# Patient Record
Sex: Female | Born: 1990 | Race: White | Hispanic: No | Marital: Married | Smoking: Former smoker
Health system: Southern US, Community
[De-identification: ages and names within clinical notes are randomized; demographics above are authoritative.]

## PROBLEM LIST (undated history)

## (undated) DIAGNOSIS — J189 Pneumonia, unspecified organism: Secondary | ICD-10-CM

## (undated) DIAGNOSIS — J45909 Unspecified asthma, uncomplicated: Secondary | ICD-10-CM

## (undated) HISTORY — DX: Pneumonia, unspecified organism: J18.9

## (undated) HISTORY — DX: Unspecified asthma, uncomplicated: J45.909

---

## 2016-08-18 DIAGNOSIS — F411 Generalized anxiety disorder: Secondary | ICD-10-CM | POA: Insufficient documentation

## 2016-08-18 HISTORY — DX: Generalized anxiety disorder: F41.1

## 2017-03-01 DIAGNOSIS — F321 Major depressive disorder, single episode, moderate: Secondary | ICD-10-CM | POA: Insufficient documentation

## 2017-03-01 HISTORY — DX: Major depressive disorder, single episode, moderate: F32.1

## 2018-03-21 DIAGNOSIS — R102 Pelvic and perineal pain: Secondary | ICD-10-CM

## 2018-03-21 HISTORY — DX: Pelvic and perineal pain: R10.2

## 2018-08-04 DIAGNOSIS — F339 Major depressive disorder, recurrent, unspecified: Secondary | ICD-10-CM

## 2018-08-04 DIAGNOSIS — Z720 Tobacco use: Secondary | ICD-10-CM

## 2018-08-04 HISTORY — DX: Tobacco use: Z72.0

## 2018-08-04 HISTORY — DX: Major depressive disorder, recurrent, unspecified: F33.9

## 2019-02-26 DIAGNOSIS — M67912 Unspecified disorder of synovium and tendon, left shoulder: Secondary | ICD-10-CM | POA: Insufficient documentation

## 2019-02-26 HISTORY — DX: Unspecified disorder of synovium and tendon, left shoulder: M67.912

## 2019-02-28 DIAGNOSIS — R109 Unspecified abdominal pain: Secondary | ICD-10-CM

## 2019-02-28 HISTORY — DX: Unspecified abdominal pain: R10.9

## 2019-04-10 DIAGNOSIS — R87810 Cervical high risk human papillomavirus (HPV) DNA test positive: Secondary | ICD-10-CM

## 2019-04-10 HISTORY — DX: Cervical high risk human papillomavirus (HPV) DNA test positive: R87.810

## 2019-10-18 ENCOUNTER — Encounter: Payer: Self-pay | Admitting: Cardiology

## 2019-10-18 ENCOUNTER — Other Ambulatory Visit: Payer: Self-pay

## 2019-10-18 ENCOUNTER — Telehealth: Payer: Self-pay | Admitting: Cardiology

## 2019-10-18 ENCOUNTER — Ambulatory Visit (INDEPENDENT_AMBULATORY_CARE_PROVIDER_SITE_OTHER): Payer: PRIVATE HEALTH INSURANCE | Admitting: Cardiology

## 2019-10-18 VITALS — BP 98/60 | HR 95 | Ht 67.0 in | Wt 181.6 lb

## 2019-10-18 DIAGNOSIS — R0602 Shortness of breath: Secondary | ICD-10-CM

## 2019-10-18 DIAGNOSIS — R0781 Pleurodynia: Secondary | ICD-10-CM

## 2019-10-18 DIAGNOSIS — I313 Pericardial effusion (noninflammatory): Secondary | ICD-10-CM

## 2019-10-18 DIAGNOSIS — I3139 Other pericardial effusion (noninflammatory): Secondary | ICD-10-CM

## 2019-10-18 DIAGNOSIS — Z79899 Other long term (current) drug therapy: Secondary | ICD-10-CM

## 2019-10-18 DIAGNOSIS — I309 Acute pericarditis, unspecified: Secondary | ICD-10-CM | POA: Diagnosis not present

## 2019-10-18 HISTORY — DX: Pleurodynia: R07.81

## 2019-10-18 HISTORY — DX: Other pericardial effusion (noninflammatory): I31.39

## 2019-10-18 HISTORY — DX: Other long term (current) drug therapy: Z79.899

## 2019-10-18 HISTORY — DX: Pericardial effusion (noninflammatory): I31.3

## 2019-10-18 HISTORY — DX: Shortness of breath: R06.02

## 2019-10-18 LAB — D-DIMER, QUANTITATIVE: D-DIMER: 0.27 mg/L FEU (ref 0.00–0.49)

## 2019-10-18 MED ORDER — COLCHICINE 0.6 MG PO TABS: 0.6000 mg | ORAL_TABLET | Freq: Two times a day (BID) | ORAL | 3 refills | Status: DC

## 2019-10-18 MED ORDER — IBUPROFEN 800 MG PO TABS: 800.0000 mg | ORAL_TABLET | Freq: Two times a day (BID) | ORAL | 3 refills | Status: DC

## 2019-10-18 NOTE — Telephone Encounter (Signed)
Precert needed for: CT ANGIO CHEST AORTA W/CM & OR WO/CM  Location: [facility]   Date: ASAP

## 2019-10-18 NOTE — Patient Instructions (Addendum)
Medication Instructions:  1) Start Colchicine 0.6 mg twice a day   2) Start Ibuprofen 800 mg every 12 hours   *If you need a refill on your cardiac medications before your next appointment, please call your pharmacy*   Lab Work: Bmp, Mag, Cbc, Sed Rate, C-reative protein, D-Dimer- Today   If you have labs (blood work) drawn today and your tests are completely normal, you will receive your results only by: Marland Kitchen MyChart Message (if you have MyChart) OR . A paper copy in the mail If you have any lab test that is abnormal or we need to change your treatment, we will call you to review the results.   Testing/Procedures: Your physician has requested that you have an echocardiogram. Echocardiography is a painless test that uses sound waves to create images of your heart. It provides your doctor with information about the size and shape of your heart and how well your heart's chambers and valves are working. This procedure takes approximately one hour. There are no restrictions for this procedure.  Your physician has ordered for you to have a chest CT     Follow-Up: At [facility], you and your health needs are our priority.  As part of our continuing mission to provide you with exceptional heart care, we have created designated Provider Care Teams.  These Care Teams include your primary Cardiologist (physician) and Advanced Practice Providers (APPs -  Physician Assistants and Nurse Practitioners) who all work together to provide you with the care you need, when you need it.  We recommend signing up for the patient portal called "MyChart".  Sign up information is provided on this After Visit Summary.  MyChart is used to connect with patients for Virtual Visits (Telemedicine).  Patients are able to view lab/test results, encounter notes, upcoming appointments, etc.  Non-urgent messages can be sent to your provider as well.   To learn more about what you can do with MyChart, go to  [email or web address].    Your next appointment:   2 week(s)  The format for your next appointment:   In Person  Provider:   Thomasene Ripple, DO   Other Instructions       After the Test: None

## 2019-10-18 NOTE — Progress Notes (Signed)
Cardiology Office Note:    Date:  10/18/2019   ID:  Vicki Hubbard, DOB 03-04-1990, MRN [id number]  PCP:  Gara Kroner, DO  Cardiologist:  Berniece Salines, DO  Electrophysiologist:  None   Referring MD: No ref. provider found  Follow up visit   History of Present Illness:    Vicki Hubbard is a 29 y.o. female with a hx of asthma presents today to be evaluated for chest pain.  The patient tells me that this all started on Sunday when she started experience significant abrupt onset of shortness of breath and chest pain.  She notes that initially she took her inhaler which made her feel worse than she felt.  She then took it easy that day and then Monday felt a little better had some physical activity which involves intimacy with her husband and suddenly after that she felt shaky was significant redness of breath and worsening chest pain.  She describes the pain as a sharp sensation which mostly occurs when she lie flat and when she starts to breathe.  She tells me when she sits up the pain feels better so the last couple days she has been leaning forward when she sits in order to have relief from the pain when she breathes.  She has also been lean on the side when she sleeps when she is not laying flat and experiencing worsening pain.  She was seen at the [facility] urgent care center at which time she was told that she needed to see cardiology.  Today in the office as I talk to her she sitting forward and tells me this is the best position when she leans forward is her breathing gets significantly better.  There is also pain with inspiration as well.  She has had no lightheadedness or dizziness.  Is never experienced such pain in her life.  Unfortunately she has not tried any medication to help relieve her pain.  Past Medical History:  Diagnosis Date  . Asthma   . PNA (pneumonia)     Past Surgical History:  Procedure Laterality Date  . CESAREAN SECTION      Current Medications: Current  Meds  Medication Sig  . [DISCONTINUED] ibuprofen (ADVIL) 800 MG tablet Take by mouth.     Allergies:   Patient has no known allergies.   Social History   Socioeconomic History  . Marital status: Married    Spouse name: Not on file  . Number of children: Not on file  . Years of education: Not on file  . Highest education level: Not on file  Occupational History  . Not on file  Tobacco Use  . Smoking status: Current Every Day Smoker    Packs/day: 1.00    Types: Cigarettes  . Smokeless tobacco: Never Used  . Tobacco comment: Rare  Substance and Sexual Activity  . Alcohol use: Yes    Comment: occsionally  . Drug use: Never  . Sexual activity: Not on file  Other Topics Concern  . Not on file  Social History Narrative  . Not on file   Social Determinants of Health   Financial Resource Strain:   . Difficulty of Paying Living Expenses: Not on file  Food Insecurity:   . Worried About Charity fundraiser in the Last Year: Not on file  . Ran Out of Food in the Last Year: Not on file  Transportation Needs:   . Lack of Transportation (Medical): Not on file  . Lack of  Transportation (Non-Medical): Not on file  Physical Activity:   . Days of Exercise per Week: Not on file  . Minutes of Exercise per Session: Not on file  Stress:   . Feeling of Stress : Not on file  Social Connections:   . Frequency of Communication with Friends and Family: Not on file  . Frequency of Social Gatherings with Friends and Family: Not on file  . Attends Religious Services: Not on file  . Active Member of Clubs or Organizations: Not on file  . Attends Archivist Meetings: Not on file  . Marital Status: Not on file     Family History: The patient's family history includes Cancer in her maternal grandfather and paternal grandmother; Diabetes in her maternal grandfather and maternal grandmother; Hypertension in her maternal grandfather and maternal grandmother.  ROS:   Review of Systems   Constitution: Negative for decreased appetite, fever and weight gain.  HENT: Negative for congestion, ear discharge, hoarse voice and sore throat.   Eyes: Negative for discharge, redness, vision loss in right eye and visual halos.  Cardiovascular: Negative for chest pain, dyspnea on exertion, leg swelling, orthopnea and palpitations.  Respiratory: Negative for cough, hemoptysis, shortness of breath and snoring.   Endocrine: Negative for heat intolerance and polyphagia.  Hematologic/Lymphatic: Negative for bleeding problem. Does not bruise/bleed easily.  Skin: Negative for flushing, nail changes, rash and suspicious lesions.  Musculoskeletal: Negative for arthritis, joint pain, muscle cramps, myalgias, neck pain and stiffness.  Gastrointestinal: Negative for abdominal pain, bowel incontinence, diarrhea and excessive appetite.  Genitourinary: Negative for decreased libido, genital sores and incomplete emptying.  Neurological: Negative for brief paralysis, focal weakness, headaches and loss of balance.  Psychiatric/Behavioral: Negative for altered mental status, depression and suicidal ideas.  Allergic/Immunologic: Negative for HIV exposure and persistent infections.    EKGs/Labs/Other Studies Reviewed:    The following studies were reviewed today:   EKG:  The ekg ordered today demonstrates sinus rhythm, heart rate 95 bpm with nonspecific ST changes.  No prior EKG for comparison.  Recent Labs: No results found for requested labs within last 8760 hours.  Recent Lipid Panel No results found for: CHOL, TRIG, HDL, CHOLHDL, VLDL, LDLCALC, LDLDIRECT  Physical Exam:    VS:  BP 98/60 (BP Location: Left Arm)   Pulse 95   Ht $R'5\' 7"'OC$  (1.702 m)   Wt 181 lb 9.6 oz (82.4 kg)   SpO2 99%   BMI 28.44 kg/m     Wt Readings from Last 3 Encounters:  10/18/19 181 lb 9.6 oz (82.4 kg)     GEN: Well nourished, well developed in no acute distress HEENT: Normal NECK: No JVD; No carotid  bruits LYMPHATICS: No lymphadenopathy CARDIAC: S1S2 noted,RRR, no murmurs, rubs, gallops RESPIRATORY:  Clear to auscultation without rales, wheezing or rhonchi  ABDOMEN: Soft, non-tender, non-distended, +bowel sounds, no guarding. EXTREMITIES: No edema, No cyanosis, no clubbing MUSCULOSKELETAL:  No deformity  SKIN: Warm and dry NEUROLOGIC:  Alert and oriented x 3, non-focal PSYCHIATRIC:  Normal affect, good insight  ASSESSMENT:    1. Acute pericarditis, unspecified type   2. Pleuritic chest pain    3. Medication management   4. SOB (shortness of breath)   5. Small circumferential Pericardial effusion    PLAN:    I was able to do a bedside echocardiogram for the patient while she was in the office limited for pericardial effusion which showed very small circumferential pericardial effusion with no evidence of cardiac tamponade.  In  addition to her clinical history I am suspecting clinical pericarditis therefore I am going to treat the patient with ibuprofen 800 mg twice daily along with colchicine 0.6 mg twice daily.  I have educated patient about both medications all of her questions has been answered she is agreeable to take these medications.  The patient need a full comprehensive transthoracic echocardiogram scheduled to make sure that her small pericardial effusion did not worsened as well assess her LV function and diastology as well.  In addition given the abrupt onset of the shortness of breath and pain still pleuritic I like to rule out pulmonary embolism therefore a chest CTA has been ordered ordered urgently.  Blood work will be done today which will include D-dimer, CRP, ESR, BMP and mag.    The patient is in agreement with the above plan. The patient left the office in stable condition.  The patient will follow up in 2 weeks.    Medication Adjustments/Labs and Tests Ordered: Current medicines are reviewed at length with the patient today.  Concerns regarding medicines  are outlined above.  Orders Placed This Encounter  Procedures  . CT ANGIO CHEST AORTA W/CM & OR WO/CM  . Basic metabolic panel  . Magnesium  . CBC  . Sedimentation rate  . C-reactive protein  . D-Dimer, Quantitative  . EKG 12-Lead  . ECHOCARDIOGRAM COMPLETE   Meds ordered this encounter  Medications  . colchicine 0.6 MG tablet    Sig: Take 1 tablet (0.6 mg total) by mouth in the morning and at bedtime.    Dispense:  180 tablet    Refill:  3  . ibuprofen (IBU) 800 MG tablet    Sig: Take 1 tablet (800 mg total) by mouth every 12 (twelve) hours.    Dispense:  180 tablet    Refill:  3    Patient Instructions  Medication Instructions:  1) Start Colchicine 0.6 mg twice a day   2) Start Ibuprofen 800 mg every 12 hours   *If you need a refill on your cardiac medications before your next appointment, please call your pharmacy*   Lab Work: Bmp, Mag, Cbc, Sed Rate, C-reative protein, D-Dimer- Today   If you have labs (blood work) drawn today and your tests are completely normal, you will receive your results only by: Marland Kitchen MyChart Message (if you have MyChart) OR . A paper copy in the mail If you have any lab test that is abnormal or we need to change your treatment, we will call you to review the results.   Testing/Procedures: Your physician has requested that you have an echocardiogram. Echocardiography is a painless test that uses sound waves to create images of your heart. It provides your doctor with information about the size and shape of your heart and how well your heart's chambers and valves are working. This procedure takes approximately one hour. There are no restrictions for this procedure.  Your physician has ordered for you to have a chest CT     Follow-Up: At [facility], you and your health needs are our priority.  As part of our continuing mission to provide you with exceptional heart care, we have created designated Provider Care Teams.  These Care Teams  include your primary Cardiologist (physician) and Advanced Practice Providers (APPs -  Physician Assistants and Nurse Practitioners) who all work together to provide you with the care you need, when you need it.  We recommend signing up for the patient portal called "MyChart".  Sign up information is provided on this After Visit Summary.  MyChart is used to connect with patients for Virtual Visits (Telemedicine).  Patients are able to view lab/test results, encounter notes, upcoming appointments, etc.  Non-urgent messages can be sent to your provider as well.   To learn more about what you can do with MyChart, go to [email or web address].    Your next appointment:   2 week(s)  The format for your next appointment:   In Person  Provider:   Berniece Salines, DO   Other Instructions       After the Test: None      Adopting a Healthy Lifestyle.  Know what a healthy weight is for you (roughly BMI <25) and aim to maintain this   Aim for 7+ servings of fruits and vegetables daily   65-80+ fluid ounces of water or unsweet tea for healthy kidneys   Limit to max 1 drink of alcohol per day; avoid smoking/tobacco   Limit animal fats in diet for cholesterol and heart health - choose grass fed whenever available   Avoid highly processed foods, and foods high in saturated/trans fats   Aim for low stress - take time to unwind and care for your mental health   Aim for 150 min of moderate intensity exercise weekly for heart health, and weights twice weekly for bone health   Aim for 7-9 hours of sleep daily   When it comes to diets, agreement about the perfect plan isnt easy to find, even among the experts. Experts at the [employer] developed an idea known as the Healthy Eating Plate. Just imagine a plate divided into logical, healthy portions.   The emphasis is on diet quality:   Load up on vegetables and fruits - one-half of your plate: Aim for color and variety, and  remember that potatoes dont count.   Go for whole grains - one-quarter of your plate: Whole wheat, barley, wheat berries, quinoa, oats, brown rice, and foods made with them. If you want pasta, go with whole wheat pasta.   Protein power - one-quarter of your plate: Fish, chicken, beans, and nuts are all healthy, versatile protein sources. Limit red meat.   The diet, however, does go beyond the plate, offering a few other suggestions.   Use healthy plant oils, such as olive, canola, soy, corn, sunflower and peanut. Check the labels, and avoid partially hydrogenated oil, which have unhealthy trans fats.   If youre thirsty, drink water. Coffee and tea are good in moderation, but skip sugary drinks and limit milk and dairy products to one or two daily servings.   The type of carbohydrate in the diet is more important than the amount. Some sources of carbohydrates, such as vegetables, fruits, whole grains, and beans-are healthier than others.   Finally, stay active  Signed, Berniece Salines, DO  10/18/2019 12:13 PM    [facility]

## 2019-10-19 LAB — BASIC METABOLIC PANEL
BUN/Creatinine Ratio: 10 (ref 9–23)
BUN: 8 mg/dL (ref 6–20)
CO2: 23 mmol/L (ref 20–29)
Calcium: 9.3 mg/dL (ref 8.7–10.2)
Chloride: 102 mmol/L (ref 96–106)
Creatinine, Ser: 0.8 mg/dL (ref 0.57–1.00)
GFR calc Af Amer: 114 mL/min/{1.73_m2} (ref >59)
GFR calc non Af Amer: 99 mL/min/{1.73_m2} (ref >59)
Glucose: 80 mg/dL (ref 65–99)
Potassium: 3.9 mmol/L (ref 3.5–5.2)
Sodium: 137 mmol/L (ref 134–144)

## 2019-10-19 LAB — CBC
Hematocrit: 42.8 % (ref 34.0–46.6)
Hemoglobin: 14.2 g/dL (ref 11.1–15.9)
MCH: 30.9 pg (ref 26.6–33.0)
MCHC: 33.2 g/dL (ref 31.5–35.7)
MCV: 93 fL (ref 79–97)
Platelets: 270 10*3/uL (ref 150–450)
RBC: 4.59 x10E6/uL (ref 3.77–5.28)
RDW: 11.8 % (ref 11.7–15.4)
WBC: 6 10*3/uL (ref 3.4–10.8)

## 2019-10-19 LAB — MAGNESIUM: Magnesium: 2.2 mg/dL (ref 1.6–2.3)

## 2019-10-19 LAB — C-REACTIVE PROTEIN: CRP: <1 mg/L (ref 0–10)

## 2019-10-19 LAB — SEDIMENTATION RATE: Sed Rate: 2 mm/hr (ref 0–32)

## 2019-10-22 ENCOUNTER — Other Ambulatory Visit: Payer: Self-pay

## 2019-10-22 ENCOUNTER — Telehealth: Payer: Self-pay | Admitting: Cardiology

## 2019-10-22 ENCOUNTER — Telehealth: Payer: Self-pay

## 2019-10-22 ENCOUNTER — Encounter (HOSPITAL_BASED_OUTPATIENT_CLINIC_OR_DEPARTMENT_OTHER): Payer: Self-pay

## 2019-10-22 ENCOUNTER — Ambulatory Visit (HOSPITAL_BASED_OUTPATIENT_CLINIC_OR_DEPARTMENT_OTHER)
Admission: RE | Admit: 2019-10-22 | Discharge: 2019-10-22 | Disposition: A | Discharge status: Home / Self Care | Payer: PRIVATE HEALTH INSURANCE | Source: Ambulatory Visit | Attending: Cardiology | Admitting: Cardiology

## 2019-10-22 DIAGNOSIS — R0781 Pleurodynia: Secondary | ICD-10-CM | POA: Diagnosis present

## 2019-10-22 MED ORDER — PROPRANOLOL HCL 10 MG PO TABS: 10.0000 mg | ORAL_TABLET | Freq: Every day | ORAL | 1 refills | Status: DC

## 2019-10-22 MED ORDER — IOHEXOL 350 MG/ML SOLN
100.0000 mL | Freq: Once | INTRAVENOUS | Status: AC | PRN
  Administered 2019-10-22: 100 mL via INTRAVENOUS

## 2019-10-22 NOTE — Telephone Encounter (Signed)
-----   Message from Thomasene Ripple, DO sent at 10/22/2019  1:59 PM EDT ----- Thankfully you do not have pulmonary embolism.  This is great news.  There is incidental finding of 3 mm nodule in your left lung.  Nothing to do for his right now.  We will have to forward this to your primary care doctor as you would have to get a repeat of your CTA in 1 year due to the nodule.

## 2019-10-22 NOTE — Telephone Encounter (Signed)
Spoke with patient regarding results and recommendation.  Patient verbalizes understanding and is agreeable to plan of care. Advised patient to call back with any issues or concerns.  

## 2019-10-22 NOTE — Telephone Encounter (Signed)
Let her know for chest pain continue to recur and I want her to go to the emergency department to be seen.

## 2019-10-22 NOTE — Telephone Encounter (Signed)
Follow Up:    Pt called and said  She is still having problems. When she lays down it feels like it is going to stop and then it speeds. It goes from low to high. She thinks it is getting worse instead of better She needs to know what she needs to do. Does hse need to come to see Dr Servando Salina?

## 2019-10-22 NOTE — Telephone Encounter (Signed)
Called patient. She reports that she does still have chest pain at times. Last night she had a episode that caused her to shake uncontrollably. She actually called ems last night they did a ekg and told her nothing urgent that they could see, however they advised to go to the emergency room but she didn't. She will try propranolol10 mg daily at night. Will let Dr. Servando Salina know of this information.

## 2019-10-22 NOTE — Telephone Encounter (Signed)
Called patient. She reports that her heart rate continues to increase and decrease she feels palpitations still also. She has been on colchicine for 4 days now and wants to know if that medicine was supposed to help relieve those symptoms because it has not.

## 2019-10-22 NOTE — Telephone Encounter (Signed)
The colchicine was to help relieve the chest pain syndrome.  If she still experiencing chest pain?  She can start propanolol 10 mg daily at nighttime for the palpitations.

## 2019-10-22 NOTE — Telephone Encounter (Signed)
Called patient back informed her that Dr. Servando Salina wants her to go to the emergency room if chest pain returns. She verbally understood. No further questions.

## 2019-10-24 ENCOUNTER — Other Ambulatory Visit: Payer: Self-pay

## 2019-10-24 ENCOUNTER — Emergency Department (HOSPITAL_BASED_OUTPATIENT_CLINIC_OR_DEPARTMENT_OTHER)
Admission: EM | Admit: 2019-10-24 | Discharge: 2019-10-24 | Disposition: A | Discharge status: Home / Self Care | Payer: PRIVATE HEALTH INSURANCE | Attending: Emergency Medicine | Admitting: Emergency Medicine

## 2019-10-24 ENCOUNTER — Emergency Department (HOSPITAL_BASED_OUTPATIENT_CLINIC_OR_DEPARTMENT_OTHER): Payer: PRIVATE HEALTH INSURANCE

## 2019-10-24 ENCOUNTER — Encounter (HOSPITAL_BASED_OUTPATIENT_CLINIC_OR_DEPARTMENT_OTHER): Payer: Self-pay | Admitting: *Deleted

## 2019-10-24 ENCOUNTER — Telehealth: Payer: Self-pay | Admitting: Cardiology

## 2019-10-24 DIAGNOSIS — R079 Chest pain, unspecified: Secondary | ICD-10-CM | POA: Diagnosis not present

## 2019-10-24 DIAGNOSIS — Z5321 Procedure and treatment not carried out due to patient leaving prior to being seen by health care provider: Secondary | ICD-10-CM | POA: Insufficient documentation

## 2019-10-24 LAB — CBC WITH DIFFERENTIAL/PLATELET
Abs Immature Granulocytes: 0.01 10*3/uL (ref 0.00–0.07)
Basophils Absolute: 0 10*3/uL (ref 0.0–0.1)
Basophils Relative: 1 %
Eosinophils Absolute: 0.1 10*3/uL (ref 0.0–0.5)
Eosinophils Relative: 2 %
HCT: 38.4 % (ref 36.0–46.0)
Hemoglobin: 12.7 g/dL (ref 12.0–15.0)
Immature Granulocytes: 0 %
Lymphocytes Relative: 37 %
Lymphs Abs: 1.9 10*3/uL (ref 0.7–4.0)
MCH: 30.4 pg (ref 26.0–34.0)
MCHC: 33.1 g/dL (ref 30.0–36.0)
MCV: 91.9 fL (ref 80.0–100.0)
Monocytes Absolute: 0.3 10*3/uL (ref 0.1–1.0)
Monocytes Relative: 6 %
Neutro Abs: 2.8 10*3/uL (ref 1.7–7.7)
Neutrophils Relative %: 54 %
Platelets: 236 10*3/uL (ref 150–400)
RBC: 4.18 MIL/uL (ref 3.87–5.11)
RDW: 11.5 % (ref 11.5–15.5)
WBC: 5.1 10*3/uL (ref 4.0–10.5)
nRBC: 0 % (ref 0.0–0.2)

## 2019-10-24 LAB — COMPREHENSIVE METABOLIC PANEL
ALT: 14 U/L (ref 0–44)
AST: 14 U/L — ABNORMAL LOW (ref 15–41)
Albumin: 4.4 g/dL (ref 3.5–5.0)
Alkaline Phosphatase: 43 U/L (ref 38–126)
Anion gap: 10 (ref 5–15)
BUN: 11 mg/dL (ref 6–20)
CO2: 23 mmol/L (ref 22–32)
Calcium: 8.8 mg/dL — ABNORMAL LOW (ref 8.9–10.3)
Chloride: 106 mmol/L (ref 98–111)
Creatinine, Ser: 0.75 mg/dL (ref 0.44–1.00)
GFR calc Af Amer: >60 mL/min (ref >60)
GFR calc non Af Amer: >60 mL/min (ref >60)
Glucose, Bld: 98 mg/dL (ref 70–99)
Potassium: 3.7 mmol/L (ref 3.5–5.1)
Sodium: 139 mmol/L (ref 135–145)
Total Bilirubin: 0.6 mg/dL (ref 0.3–1.2)
Total Protein: 7.1 g/dL (ref 6.5–8.1)

## 2019-10-24 LAB — TROPONIN I (HIGH SENSITIVITY): Troponin I (High Sensitivity): <2 ng/L (ref <18)

## 2019-10-24 NOTE — Telephone Encounter (Signed)
Spoke to patient just now and she let me know that her chest pain started at 11:15 AM. She states that it is continuous and is all the way across her chest. Position changes do not help. Her current blood pressure is  117/77, HR is 65. She has not taken any medications at this time. She took her normal scheduled medications this morning. Denies any shortness of breath, dizziness, or pressure on the chest. She states that she just feels tired and has this chest pain.   I advised that she proceed to the ED at this time. She states that she is unsure if she will go or not at this time but I did encourage her to go and be evaluated as I felt like this was best with the continuous chest pain with no relief.

## 2019-10-24 NOTE — Telephone Encounter (Signed)
Pt c/o of Chest Pain: STAT if CP now or developed within 24 hours  1. Are you having CP right now? yes  2. Are you experiencing any other symptoms (ex. SOB, nausea, vomiting, sweating)? no  3. How long have you been experiencing CP? An hour and a half  4. Is your CP continuous or coming and going? Continuous   5. Have you taken Nitroglycerin? No   Patient states she has been having chest pain for an hour and a half. She states she is having it across her entire chest, back, and bottom of her neck. She states it came of suddenly and she is not having any other symptoms. ?

## 2019-10-24 NOTE — ED Triage Notes (Signed)
C/o x 1 day , recent dx pericarditis

## 2019-10-25 ENCOUNTER — Encounter: Payer: Self-pay | Admitting: Cardiology

## 2019-10-25 NOTE — Telephone Encounter (Signed)
I did review her labs and they are all within normal.

## 2019-10-25 NOTE — Telephone Encounter (Signed)
Patient states she went to [facility] and waited for 6 and a half hours, but was never seen. She states they did lab work and an EKG and would like Dr. Servando Salina to review it.

## 2019-10-25 NOTE — Telephone Encounter (Signed)
error 

## 2019-11-02 ENCOUNTER — Encounter: Payer: Self-pay | Admitting: Cardiology

## 2019-11-02 ENCOUNTER — Other Ambulatory Visit: Payer: Self-pay

## 2019-11-02 ENCOUNTER — Ambulatory Visit (INDEPENDENT_AMBULATORY_CARE_PROVIDER_SITE_OTHER): Payer: PRIVATE HEALTH INSURANCE | Admitting: Cardiology

## 2019-11-02 VITALS — BP 110/80 | HR 82 | Ht 67.0 in | Wt 179.0 lb

## 2019-11-02 DIAGNOSIS — I309 Acute pericarditis, unspecified: Secondary | ICD-10-CM

## 2019-11-02 NOTE — Progress Notes (Signed)
Cardiology Office Note:    Date:  11/02/2019   ID:  Vicki Hubbard, DOB [birth year], MRN [id number]  PCP:  Gara Kroner, DO  Cardiologist:  Berniece Salines, DO  Electrophysiologist:  None   Referring MD: Gara Kroner, DO   Chief Complaint  Patient presents with  . Follow-up    History of Present Illness:    Vicki Hubbard is a 29 y.o. female with a hx of Asthma, I did see the  Patient on 10/18/2019 at that time she was experiencing chest pain. During that visit her chest pain was suspected to be Acute pericarditis. She was started on Ibuprofen and colchicine. At that time we did CRP and ESR all was within normal.   In the interim she went to the ED at [facility] and was not seen. I did review her records from the visit but was within normal. She had intermittent palpitations and was started on propanolol. She did have adverse to the propanolol and therefore stopped this medication.   She tells me that she is slightly improving. NO other complains at this time.    Past Medical History:  Diagnosis Date  . Asthma   . PNA (pneumonia)     Past Surgical History:  Procedure Laterality Date  . CESAREAN SECTION      Current Medications: Current Meds  Medication Sig  . colchicine 0.6 MG tablet Take 1 tablet (0.6 mg total) by mouth in the morning and at bedtime.  Marland Kitchen ibuprofen (IBU) 800 MG tablet Take 1 tablet (800 mg total) by mouth every 12 (twelve) hours.     Allergies:   Patient has no known allergies.   Social History   Socioeconomic History  . Marital status: Married    Spouse name: Not on file  . Number of children: Not on file  . Years of education: Not on file  . Highest education level: Not on file  Occupational History  . Not on file  Tobacco Use  . Smoking status: Former Smoker    Packs/day: 1.00    Types: Cigarettes    Quit date: 03/2019    Years since quitting: 0.6  . Smokeless tobacco: Never Used  . Tobacco comment: Rare  Substance and Sexual  Activity  . Alcohol use: Yes    Comment: occsionally  . Drug use: Never  . Sexual activity: Not on file  Other Topics Concern  . Not on file  Social History Narrative  . Not on file   Social Determinants of Health   Financial Resource Strain:   . Difficulty of Paying Living Expenses: Not on file  Food Insecurity:   . Worried About Charity fundraiser in the Last Year: Not on file  . Ran Out of Food in the Last Year: Not on file  Transportation Needs:   . Lack of Transportation (Medical): Not on file  . Lack of Transportation (Non-Medical): Not on file  Physical Activity:   . Days of Exercise per Week: Not on file  . Minutes of Exercise per Session: Not on file  Stress:   . Feeling of Stress : Not on file  Social Connections:   . Frequency of Communication with Friends and Family: Not on file  . Frequency of Social Gatherings with Friends and Family: Not on file  . Attends Religious Services: Not on file  . Active Member of Clubs or Organizations: Not on file  . Attends Archivist Meetings: Not on file  .  Marital Status: Not on file     Family History: The patient's family history includes Cancer in her maternal grandfather and paternal grandmother; Diabetes in her maternal grandfather and maternal grandmother; Hypertension in her maternal grandfather and maternal grandmother.  ROS:   Review of Systems  Constitution: Negative for decreased appetite, fever and weight gain.  HENT: Negative for congestion, ear discharge, hoarse voice and sore throat.   Eyes: Negative for discharge, redness, vision loss in right eye and visual halos.  Cardiovascular: Negative for chest pain, dyspnea on exertion, leg swelling, orthopnea and palpitations.  Respiratory: Negative for cough, hemoptysis, shortness of breath and snoring.   Endocrine: Negative for heat intolerance and polyphagia.  Hematologic/Lymphatic: Negative for bleeding problem. Does not bruise/bleed easily.  Skin:  Negative for flushing, nail changes, rash and suspicious lesions.  Musculoskeletal: Negative for arthritis, joint pain, muscle cramps, myalgias, neck pain and stiffness.  Gastrointestinal: Negative for abdominal pain, bowel incontinence, diarrhea and excessive appetite.  Genitourinary: Negative for decreased libido, genital sores and incomplete emptying.  Neurological: Negative for brief paralysis, focal weakness, headaches and loss of balance.  Psychiatric/Behavioral: Negative for altered mental status, depression and suicidal ideas.  Allergic/Immunologic: Negative for HIV exposure and persistent infections.    EKGs/Labs/Other Studies Reviewed:    The following studies were reviewed today:   EKG:  None   Recent Labs: 10/18/2019: Magnesium 2.2 10/24/2019: ALT 14; BUN 11; Creatinine, Ser 0.75; Hemoglobin 12.7; Platelets 236; Potassium 3.7; Sodium 139  Recent Lipid Panel No results found for: CHOL, TRIG, HDL, CHOLHDL, VLDL, LDLCALC, LDLDIRECT  Physical Exam:    VS:  BP 110/80 (BP Location: Left Arm, Patient Position: Sitting, Cuff Size: Normal)   Pulse 82   Ht $R'5\' 7"'Xf$  (1.702 m)   Wt 179 lb (81.2 kg)   LMP 10/23/2019   SpO2 96%   BMI 28.04 kg/m     Wt Readings from Last 3 Encounters:  11/02/19 179 lb (81.2 kg)  10/24/19 180 lb (81.6 kg)  10/18/19 181 lb 9.6 oz (82.4 kg)     GEN: Well nourished, well developed in no acute distress HEENT: Normal NECK: No JVD; No carotid bruits LYMPHATICS: No lymphadenopathy CARDIAC: S1S2 noted,RRR, no murmurs, rubs, gallops RESPIRATORY:  Clear to auscultation without rales, wheezing or rhonchi  ABDOMEN: Soft, non-tender, non-distended, +bowel sounds, no guarding. EXTREMITIES: No edema, No cyanosis, no clubbing MUSCULOSKELETAL:  No deformity  SKIN: Warm and dry NEUROLOGIC:  Alert and oriented x 3, non-focal PSYCHIATRIC:  Normal affect, good insight  ASSESSMENT:    1. Acute pericarditis    PLAN:     She will continue her colchicine and  will complete course on 01/17/20. I advised that she stop her ibuprofen in the next 2 week.    The patient is in agreement with the above plan. The patient left the office in stable condition.  The patient will follow up in 3 months.   Medication Adjustments/Labs and Tests Ordered: Current medicines are reviewed at length with the patient today.  Concerns regarding medicines are outlined above.  No orders of the defined types were placed in this encounter.  No orders of the defined types were placed in this encounter.   Patient Instructions  Medication Instructions:  Your physician has recommended you make the following change in your medication:  STOP: Colchicine on 01/17/2020 *If you need a refill on your cardiac medications before your next appointment, please call your pharmacy*   Lab Work: None If you have labs (blood work) drawn  today and your tests are completely normal, you will receive your results only by: Marland Kitchen MyChart Message (if you have MyChart) OR . A paper copy in the mail If you have any lab test that is abnormal or we need to change your treatment, we will call you to review the results.   Testing/Procedures: None   Follow-Up: At [facility], you and your health needs are our priority.  As part of our continuing mission to provide you with exceptional heart care, we have created designated Provider Care Teams.  These Care Teams include your primary Cardiologist (physician) and Advanced Practice Providers (APPs -  Physician Assistants and Nurse Practitioners) who all work together to provide you with the care you need, when you need it.  We recommend signing up for the patient portal called "MyChart".  Sign up information is provided on this After Visit Summary.  MyChart is used to connect with patients for Virtual Visits (Telemedicine).  Patients are able to view lab/test results, encounter notes, upcoming appointments, etc.  Non-urgent messages can be sent to your  provider as well.   To learn more about what you can do with MyChart, go to [email or web address].    Your next appointment:   10 week(s)  The format for your next appointment:   In Person  Provider:   Berniece Salines, DO   Other Instructions      Adopting a Healthy Lifestyle.  Know what a healthy weight is for you (roughly BMI <25) and aim to maintain this   Aim for 7+ servings of fruits and vegetables daily   65-80+ fluid ounces of water or unsweet tea for healthy kidneys   Limit to max 1 drink of alcohol per day; avoid smoking/tobacco   Limit animal fats in diet for cholesterol and heart health - choose grass fed whenever available   Avoid highly processed foods, and foods high in saturated/trans fats   Aim for low stress - take time to unwind and care for your mental health   Aim for 150 min of moderate intensity exercise weekly for heart health, and weights twice weekly for bone health   Aim for 7-9 hours of sleep daily   When it comes to diets, agreement about the perfect plan isnt easy to find, even among the experts. Experts at the [employer] developed an idea known as the Healthy Eating Plate. Just imagine a plate divided into logical, healthy portions.   The emphasis is on diet quality:   Load up on vegetables and fruits - one-half of your plate: Aim for color and variety, and remember that potatoes dont count.   Go for whole grains - one-quarter of your plate: Whole wheat, barley, wheat berries, quinoa, oats, brown rice, and foods made with them. If you want pasta, go with whole wheat pasta.   Protein power - one-quarter of your plate: Fish, chicken, beans, and nuts are all healthy, versatile protein sources. Limit red meat.   The diet, however, does go beyond the plate, offering a few other suggestions.   Use healthy plant oils, such as olive, canola, soy, corn, sunflower and peanut. Check the labels, and avoid partially hydrogenated  oil, which have unhealthy trans fats.   If youre thirsty, drink water. Coffee and tea are good in moderation, but skip sugary drinks and limit milk and dairy products to one or two daily servings.   The type of carbohydrate in the diet is more important than the amount. Some  sources of carbohydrates, such as vegetables, fruits, whole grains, and beans-are healthier than others.   Finally, stay active  Signed, Berniece Salines, DO  11/02/2019 9:00 PM    [place]

## 2019-11-02 NOTE — Patient Instructions (Signed)
Medication Instructions:  Your physician has recommended you make the following change in your medication:  STOP: Colchicine on 01/17/2020 *If you need a refill on your cardiac medications before your next appointment, please call your pharmacy*   Lab Work: None If you have labs (blood work) drawn today and your tests are completely normal, you will receive your results only by: Marland Kitchen MyChart Message (if you have MyChart) OR . A paper copy in the mail If you have any lab test that is abnormal or we need to change your treatment, we will call you to review the results.   Testing/Procedures: None   Follow-Up: At [facility], you and your health needs are our priority.  As part of our continuing mission to provide you with exceptional heart care, we have created designated Provider Care Teams.  These Care Teams include your primary Cardiologist (physician) and Advanced Practice Providers (APPs -  Physician Assistants and Nurse Practitioners) who all work together to provide you with the care you need, when you need it.  We recommend signing up for the patient portal called "MyChart".  Sign up information is provided on this After Visit Summary.  MyChart is used to connect with patients for Virtual Visits (Telemedicine).  Patients are able to view lab/test results, encounter notes, upcoming appointments, etc.  Non-urgent messages can be sent to your provider as well.   To learn more about what you can do with MyChart, go to [email or web address].    Your next appointment:   10 week(s)  The format for your next appointment:   In Person  Provider:   Thomasene Ripple, DO   Other Instructions

## 2019-11-08 ENCOUNTER — Other Ambulatory Visit: Payer: Self-pay

## 2019-11-08 ENCOUNTER — Ambulatory Visit (INDEPENDENT_AMBULATORY_CARE_PROVIDER_SITE_OTHER): Payer: PRIVATE HEALTH INSURANCE

## 2019-11-08 DIAGNOSIS — R0602 Shortness of breath: Secondary | ICD-10-CM | POA: Diagnosis not present

## 2019-11-08 LAB — ECHOCARDIOGRAM COMPLETE
Area-P 1/2: 5.38 cm2
S' Lateral: 3.5 cm

## 2019-11-08 NOTE — Progress Notes (Signed)
Complete echocardiogram performed.  Jimmy Timmy Bubeck RDCS, RVT  

## 2019-11-12 ENCOUNTER — Telehealth: Payer: Self-pay

## 2019-11-12 NOTE — Telephone Encounter (Signed)
Spoke with patient regarding results and recommendation.  Patient verbalizes understanding and is agreeable to plan of care. Advised patient to call back with any issues or concerns.  

## 2019-11-12 NOTE — Telephone Encounter (Signed)
-----   Message from Thomasene Ripple, DO sent at 11/11/2019  8:03 PM EDT ----- Normal echo

## 2019-11-12 NOTE — Telephone Encounter (Signed)
-----   Message from Kardie Tobb, DO sent at 11/11/2019  8:03 PM EDT ----- Normal echo 

## 2019-11-12 NOTE — Telephone Encounter (Signed)
Left message on patients voicemail to please return our call.   

## 2019-12-04 ENCOUNTER — Telehealth: Payer: Self-pay | Admitting: Cardiology

## 2019-12-04 NOTE — Telephone Encounter (Signed)
Contacted patient today via phone.  She received her first COVID-19 vaccination and was diagnosed with pericarditis by Dr. Servando Salina.  She reports compliance with colchicine medication.  Follow-up echocardiogram was normal.  She indicates that her place of employment has been discussing mandating full vaccination.  She requests advice on whether she should receive the second vaccination or obtain medical exemption.

## 2019-12-04 NOTE — Telephone Encounter (Signed)
I would recommend she get her second dose of her vaccine.  She is currently being treated for a very mild case of pericarditis.

## 2019-12-04 NOTE — Telephone Encounter (Signed)
Patient contacted and Dr. Mallory Shirk recommendation for receiving a second Covid vaccination expressed.  We reviewed the risk versus benefits of vaccination versus contracting COVID-19.  She expressed understanding.  No further questions at this time.

## 2019-12-04 NOTE — Telephone Encounter (Signed)
Patient has some questions regarding her diagnosis and getting the covid vaccine. She states that she got the first shot but never got the second. Her work is leaning towards making it mandatory and she does not feel comfortable getting the second shot. She is requesting to speak with Dr. Servando Salina or her nurse in regards to this. Please advise.

## 2020-01-11 ENCOUNTER — Ambulatory Visit: Payer: PRIVATE HEALTH INSURANCE | Admitting: Cardiology

## 2020-02-28 DIAGNOSIS — J45909 Unspecified asthma, uncomplicated: Secondary | ICD-10-CM | POA: Insufficient documentation

## 2020-02-28 DIAGNOSIS — J189 Pneumonia, unspecified organism: Secondary | ICD-10-CM | POA: Insufficient documentation

## 2020-03-03 ENCOUNTER — Ambulatory Visit (INDEPENDENT_AMBULATORY_CARE_PROVIDER_SITE_OTHER): Payer: PRIVATE HEALTH INSURANCE | Admitting: Cardiology

## 2020-03-03 ENCOUNTER — Ambulatory Visit (INDEPENDENT_AMBULATORY_CARE_PROVIDER_SITE_OTHER): Payer: PRIVATE HEALTH INSURANCE

## 2020-03-03 ENCOUNTER — Encounter: Payer: Self-pay | Admitting: Cardiology

## 2020-03-03 ENCOUNTER — Other Ambulatory Visit: Payer: Self-pay

## 2020-03-03 VITALS — BP 110/68 | HR 82 | Ht 67.0 in | Wt 186.6 lb

## 2020-03-03 DIAGNOSIS — R002 Palpitations: Secondary | ICD-10-CM

## 2020-03-03 DIAGNOSIS — I301 Infective pericarditis: Secondary | ICD-10-CM

## 2020-03-03 MED ORDER — PROPRANOLOL HCL 20 MG PO TABS: 20.0000 mg | ORAL_TABLET | Freq: Two times a day (BID) | ORAL | 1 refills | Status: DC

## 2020-03-03 MED ORDER — PROPRANOLOL HCL 20 MG PO TABS: 20.0000 mg | ORAL_TABLET | Freq: Two times a day (BID) | ORAL | 3 refills | Status: AC

## 2020-03-03 MED ORDER — PROPRANOLOL HCL 20 MG PO TABS: 20.0000 mg | ORAL_TABLET | Freq: Three times a day (TID) | ORAL | 3 refills | Status: DC

## 2020-03-03 NOTE — Progress Notes (Signed)
Cardiology Office Note:    Date:  03/03/2020   ID:  Vicki Hubbard, DOB 07-Aug-1990, MRN [id number]  PCP:  Montez Hageman, DO  Cardiologist:  Thomasene Ripple, DO  Electrophysiologist:  None   Referring MD: Montez Hageman, DO   I am having some palpitations  History of Present Illness:    Vicki Hubbard is a 30 y.o. female with a hx of Asthma, I did see the  Patient on 10/18/2019 at that time she was experiencing chest pain. During that visit her chest pain was suspected to be Acute pericarditis. She was started on Ibuprofen and colchicine. At that time we did CRP and ESR all was within normal.   I saw the patient on 01/02/2020 at that time I continue her colchicine to complete her course she is here today for follow-up visit.  Today the patient tells me that she has been experiencing some intermittent palpitation she feels like her heart is beating significantly fast and at times she gets short of breath.  Past Medical History:  Diagnosis Date  . Abdominal pain 02/28/2019  . Asthma   . Current moderate episode of major depressive disorder without prior episode (HCC) 03/01/2017  . GAD (generalized anxiety disorder) 08/18/2016  . Major depression, recurrent, chronic (HCC) 08/04/2018  . Medication management 10/18/2019  . Papanicolaou smear of cervix with positive high risk human papilloma virus (HPV) test 04/10/2019   Formatting of this note might be different from the original. 03/27/19 - NILM, HR HPV positive - repeat co-test in 1 year 03/2020  . Pelvic pain in female 03/21/2018   Formatting of this note might be different from the original. 03/21/2018 needs pelvic ultrasound. Scheduled 03/27/2018-CW  . Pleuritic chest pain  10/18/2019  . PNA (pneumonia)   . Rotator cuff disorder, left 02/26/2019  . Small circumferential Pericardial effusion 10/18/2019  . SOB (shortness of breath) 10/18/2019  . Tobacco abuse disorder 08/04/2018    Past Surgical History:  Procedure Laterality Date  . CESAREAN SECTION       Current Medications: Current Meds  Medication Sig  . propranolol (INDERAL) 20 MG tablet Take 1 tablet (20 mg total) by mouth 2 (two) times daily.  . [DISCONTINUED] propranolol (INDERAL) 20 MG tablet Take 1 tablet (20 mg total) by mouth 2 (two) times daily.  . [DISCONTINUED] propranolol (INDERAL) 20 MG tablet Take 1 tablet (20 mg total) by mouth 3 (three) times daily.     Allergies:   Patient has no known allergies.   Social History   Socioeconomic History  . Marital status: Married    Spouse name: Not on file  . Number of children: Not on file  . Years of education: Not on file  . Highest education level: Not on file  Occupational History  . Not on file  Tobacco Use  . Smoking status: Former Smoker    Packs/day: 1.00    Types: Cigarettes    Quit date: 03/2019    Years since quitting: 0.9  . Smokeless tobacco: Never Used  . Tobacco comment: Rare  Substance and Sexual Activity  . Alcohol use: Yes    Comment: occsionally  . Drug use: Never  . Sexual activity: Not on file  Other Topics Concern  . Not on file  Social History Narrative  . Not on file   Social Determinants of Health   Financial Resource Strain: Not on file  Food Insecurity: Not on file  Transportation Needs: Not on file  Physical Activity: Not on  file  Stress: Not on file  Social Connections: Not on file     Family History: The patient's family history includes Cancer in her maternal grandfather and paternal grandmother; Diabetes in her maternal grandfather and maternal grandmother; Hypertension in her maternal grandfather and maternal grandmother.  ROS:   Review of Systems  Constitution: Negative for decreased appetite, fever and weight gain.  HENT: Negative for congestion, ear discharge, hoarse voice and sore throat.   Eyes: Negative for discharge, redness, vision loss in right eye and visual halos.  Cardiovascular: Negative for chest pain, dyspnea on exertion, leg swelling, orthopnea and  palpitations.  Respiratory: Negative for cough, hemoptysis, shortness of breath and snoring.   Endocrine: Negative for heat intolerance and polyphagia.  Hematologic/Lymphatic: Negative for bleeding problem. Does not bruise/bleed easily.  Skin: Negative for flushing, nail changes, rash and suspicious lesions.  Musculoskeletal: Negative for arthritis, joint pain, muscle cramps, myalgias, neck pain and stiffness.  Gastrointestinal: Negative for abdominal pain, bowel incontinence, diarrhea and excessive appetite.  Genitourinary: Negative for decreased libido, genital sores and incomplete emptying.  Neurological: Negative for brief paralysis, focal weakness, headaches and loss of balance.  Psychiatric/Behavioral: Negative for altered mental status, depression and suicidal ideas.  Allergic/Immunologic: Negative for HIV exposure and persistent infections.    EKGs/Labs/Other Studies Reviewed:    The following studies were reviewed today:   EKG:  The ekg ordered today demonstrates   Transthoracic echocardiogramIMPRESSIONS    1. Left ventricular ejection fraction, by estimation, is 60 to 65%. The  left ventricle has normal function. The left ventricle has no regional  wall motion abnormalities. Left ventricular diastolic parameters were  normal.  2. Right ventricular systolic function is normal. The right ventricular  size is normal. There is normal pulmonary artery systolic pressure.  3. The mitral valve is normal in structure. No evidence of mitral valve  regurgitation. No evidence of mitral stenosis.  4. The aortic valve is tricuspid. Aortic valve regurgitation is not  visualized. No aortic stenosis is present.  5. The inferior vena cava is normal in size with greater than 50%  respiratory variability, suggesting right atrial pressure of 3 mmHg.   FINDINGS  Left Ventricle: Left ventricular ejection fraction, by estimation, is 60  to 65%. The left ventricle has normal function.  The left ventricle has no  regional wall motion abnormalities. The left ventricular internal cavity  size was normal in size. There is  no left ventricular hypertrophy. Left ventricular diastolic parameters  were normal. Normal left ventricular filling pressure.   Right Ventricle: The right ventricular size is normal. No increase in  right ventricular wall thickness. Right ventricular systolic function is  normal. There is normal pulmonary artery systolic pressure. The tricuspid  regurgitant velocity is 1.83 m/s, and  with an assumed right atrial pressure of 3 mmHg, the estimated right  ventricular systolic pressure is 47.8 mmHg.   Left Atrium: Left atrial size was normal in size.   Right Atrium: Right atrial size was normal in size.   Pericardium: There is no evidence of pericardial effusion.   Mitral Valve: The mitral valve is normal in structure. No evidence of  mitral valve regurgitation. No evidence of mitral valve stenosis.   Tricuspid Valve: The tricuspid valve is normal in structure. Tricuspid  valve regurgitation is trivial. No evidence of tricuspid stenosis.   Aortic Valve: The aortic valve is tricuspid. Aortic valve regurgitation is  not visualized. No aortic stenosis is present.   Pulmonic Valve: The pulmonic valve  was normal in structure. Pulmonic valve  regurgitation is not visualized. No evidence of pulmonic stenosis.   Aorta: The aortic root and ascending aorta are structurally normal, with  no evidence of dilitation.   Venous: A systolic blunting flow pattern is recorded from the right upper  pulmonary vein. The inferior vena cava is normal in size with greater than  50% respiratory variability, suggesting right atrial pressure of 3 mmHg.   IAS/Shunts: No atrial level shunt detected by color flow Doppler.      Recent Labs: 10/18/2019: Magnesium 2.2 10/24/2019: ALT 14; BUN 11; Creatinine, Ser 0.75; Hemoglobin 12.7; Platelets 236; Potassium 3.7; Sodium 139   Recent Lipid Panel No results found for: CHOL, TRIG, HDL, CHOLHDL, VLDL, LDLCALC, LDLDIRECT  Physical Exam:    VS:  BP 110/68   Pulse 82   Ht $R'5\' 7"'tD$  (1.702 m)   Wt 186 lb 9.6 oz (84.6 kg)   SpO2 98%   BMI 29.23 kg/m     Wt Readings from Last 3 Encounters:  03/03/20 186 lb 9.6 oz (84.6 kg)  11/02/19 179 lb (81.2 kg)  10/24/19 180 lb (81.6 kg)     GEN: Well nourished, well developed in no acute distress HEENT: Normal NECK: No JVD; No carotid bruits LYMPHATICS: No lymphadenopathy CARDIAC: S1S2 noted,RRR, no murmurs, rubs, gallops RESPIRATORY:  Clear to auscultation without rales, wheezing or rhonchi  ABDOMEN: Soft, non-tender, non-distended, +bowel sounds, no guarding. EXTREMITIES: No edema, No cyanosis, no clubbing MUSCULOSKELETAL:  No deformity  SKIN: Warm and dry NEUROLOGIC:  Alert and oriented x 3, non-focal PSYCHIATRIC:  Normal affect, good insight  ASSESSMENT:    1. Palpitations   2. Acute viral pericarditis    PLAN:     I would like to rule out a cardiovascular etiology of this palpitation, therefore at this time I would like to placed a zio patch for 14 days. In additon I started patient on propanolol 20 mg twice a day once she has 1 day monitor for 7 days.   The patient is in agreement with the above plan. The patient left the office in stable condition.  The patient will follow up in 6 weeks or sooner if needed.   Medication Adjustments/Labs and Tests Ordered: Current medicines are reviewed at length with the patient today.  Concerns regarding medicines are outlined above.  Orders Placed This Encounter  Procedures  . LONG TERM MONITOR (3-14 DAYS)   Meds ordered this encounter  Medications  . DISCONTD: propranolol (INDERAL) 20 MG tablet    Sig: Take 1 tablet (20 mg total) by mouth 2 (two) times daily.    Dispense:  60 tablet    Refill:  1  . DISCONTD: propranolol (INDERAL) 20 MG tablet    Sig: Take 1 tablet (20 mg total) by mouth 3 (three) times  daily.    Dispense:  180 tablet    Refill:  3  . propranolol (INDERAL) 20 MG tablet    Sig: Take 1 tablet (20 mg total) by mouth 2 (two) times daily.    Dispense:  180 tablet    Refill:  3    Patient Instructions  Medication Instructions:  Your physician has recommended you make the following change in your medication: START: Propranolol 20 mg twice daily. Take one tablet by mouth two times a day.   *If you need a refill on your cardiac medications before your next appointment, please call your pharmacy*   Lab Work: None If you have labs (blood work)  drawn today and your tests are completely normal, you will receive your results only by: Marland Kitchen MyChart Message (if you have MyChart) OR . A paper copy in the mail If you have any lab test that is abnormal or we need to change your treatment, we will call you to review the results.   Testing/Procedures: A zio monitor was ordered today. It will remain on for 14 days. You will then return monitor and event diary in provided box. It takes 1-2 weeks for report to be downloaded and returned to [place]. We will call you with the results. If monitor falls off or has orange flashing light, please call Zio for further instructions.   Follow-Up: At [facility], you and your health needs are our priority.  As part of our continuing mission to provide you with exceptional heart care, we have created designated Provider Care Teams.  These Care Teams include your primary Cardiologist (physician) and Advanced Practice Providers (APPs -  Physician Assistants and Nurse Practitioners) who all work together to provide you with the care you need, when you need it.  We recommend signing up for the patient portal called "MyChart".  Sign up information is provided on this After Visit Summary.  MyChart is used to connect with patients for Virtual Visits (Telemedicine).  Patients are able to view lab/test results, encounter notes, upcoming appointments, etc.  Non-urgent  messages can be sent to your provider as well.   To learn more about what you can do with MyChart, go to [email or web address].    Your next appointment:   6 week(s)  The format for your next appointment:   In Person  Provider:   Berniece Salines, DO   Other Instructions ZIO XT  . Call [employer] with any questions during wear 24/7: [phone]  . Zio patch should be worn for 14 days unless otherwise instructed  . Do not shower for 24 hours after Zio is applied (sponge bath is fine, just keep the patch dry)  . After that, when showering avoid direct shower stream of water onto Zio patch, pat dry around the patch  . Avoid excessive sweating  . Push the button when you are feeling any cardiac symptoms and record them in the logbook or on the "My Zio" App  . Refer to the removal date listed on the front of the symptom logbook and remove Zio patch according to the instructions in the back of the logbook  . Place Zio patch and symptom logbook inside the provided pre-labeled box, tape the box, & place in outgoing [employer] mailbox      Adopting a Healthy Lifestyle.  Know what a healthy weight is for you (roughly BMI <25) and aim to maintain this   Aim for 7+ servings of fruits and vegetables daily   65-80+ fluid ounces of water or unsweet tea for healthy kidneys   Limit to max 1 drink of alcohol per day; avoid smoking/tobacco   Limit animal fats in diet for cholesterol and heart health - choose grass fed whenever available   Avoid highly processed foods, and foods high in saturated/trans fats   Aim for low stress - take time to unwind and care for your mental health   Aim for 150 min of moderate intensity exercise weekly for heart health, and weights twice weekly for bone health   Aim for 7-9 hours of sleep daily   When it comes to diets, agreement about the perfect plan isnt easy to find, even among  the experts. Experts at the [employer] developed an  idea known as the Healthy Eating Plate. Just imagine a plate divided into logical, healthy portions.   The emphasis is on diet quality:   Load up on vegetables and fruits - one-half of your plate: Aim for color and variety, and remember that potatoes dont count.   Go for whole grains - one-quarter of your plate: Whole wheat, barley, wheat berries, quinoa, oats, brown rice, and foods made with them. If you want pasta, go with whole wheat pasta.   Protein power - one-quarter of your plate: Fish, chicken, beans, and nuts are all healthy, versatile protein sources. Limit red meat.   The diet, however, does go beyond the plate, offering a few other suggestions.   Use healthy plant oils, such as olive, canola, soy, corn, sunflower and peanut. Check the labels, and avoid partially hydrogenated oil, which have unhealthy trans fats.   If youre thirsty, drink water. Coffee and tea are good in moderation, but skip sugary drinks and limit milk and dairy products to one or two daily servings.   The type of carbohydrate in the diet is more important than the amount. Some sources of carbohydrates, such as vegetables, fruits, whole grains, and beans-are healthier than others.   Finally, stay active  Signed, Berniece Salines, DO  03/03/2020 2:42 PM    [facility]

## 2020-03-03 NOTE — Patient Instructions (Signed)
Medication Instructions:  Your physician has recommended you make the following change in your medication: START: Propranolol 20 mg twice daily. Take one tablet by mouth two times a day.   *If you need a refill on your cardiac medications before your next appointment, please call your pharmacy*   Lab Work: None If you have labs (blood work) drawn today and your tests are completely normal, you will receive your results only by: Marland Kitchen MyChart Message (if you have MyChart) OR . A paper copy in the mail If you have any lab test that is abnormal or we need to change your treatment, we will call you to review the results.   Testing/Procedures: A zio monitor was ordered today. It will remain on for 14 days. You will then return monitor and event diary in provided box. It takes 1-2 weeks for report to be downloaded and returned to [place]. We will call you with the results. If monitor falls off or has orange flashing light, please call Zio for further instructions.   Follow-Up: At [facility], you and your health needs are our priority.  As part of our continuing mission to provide you with exceptional heart care, we have created designated Provider Care Teams.  These Care Teams include your primary Cardiologist (physician) and Advanced Practice Providers (APPs -  Physician Assistants and Nurse Practitioners) who all work together to provide you with the care you need, when you need it.  We recommend signing up for the patient portal called "MyChart".  Sign up information is provided on this After Visit Summary.  MyChart is used to connect with patients for Virtual Visits (Telemedicine).  Patients are able to view lab/test results, encounter notes, upcoming appointments, etc.  Non-urgent messages can be sent to your provider as well.   To learn more about what you can do with MyChart, go to [email or web address].    Your next appointment:   6 week(s)  The format for your next appointment:   In  Person  Provider:   Thomasene Ripple, DO   Other Instructions ZIO XT  . Call [employer] with any questions during wear 24/7: [phone]  . Zio patch should be worn for 14 days unless otherwise instructed  . Do not shower for 24 hours after Zio is applied (sponge bath is fine, just keep the patch dry)  . After that, when showering avoid direct shower stream of water onto Zio patch, pat dry around the patch  . Avoid excessive sweating  . Push the button when you are feeling any cardiac symptoms and record them in the logbook or on the "My Zio" App  . Refer to the removal date listed on the front of the symptom logbook and remove Zio patch according to the instructions in the back of the logbook  . Place Zio patch and symptom logbook inside the provided pre-labeled box, tape the box, & place in outgoing [employer]

## 2020-03-18 ENCOUNTER — Telehealth: Payer: Self-pay | Admitting: Cardiology

## 2020-03-18 NOTE — Telephone Encounter (Signed)
    Pt c/o medication issue:  1. Name of Medication: novocaine  2. How are you currently taking this medication (dosage and times per day)?   3. Are you having a reaction (difficulty breathing--STAT)?   4. What is your medication issue? Pt said she went to her dentist today and she received a couple of shots of novocaine, she said since she got home she's been feeling palpitations and her heart skipped beating, and some SOB. No CP. She asked if this is something she needs to be worried about

## 2020-03-19 NOTE — Telephone Encounter (Signed)
Called and left a message for patient and sent a MyChart message in regards to monitoring the palpations. Patient encouraged to reach out with any further questions.

## 2020-03-19 NOTE — Telephone Encounter (Signed)
Please let the patient know that she would just have to monitor the palpitations since medication can actively different people, I am unable to really predict how this is well treat her.

## 2020-04-14 ENCOUNTER — Other Ambulatory Visit: Payer: Self-pay

## 2020-04-15 ENCOUNTER — Ambulatory Visit: Payer: PRIVATE HEALTH INSURANCE | Admitting: Cardiology

## 2021-01-29 IMAGING — CT CT ANGIO CHEST
2 of 6 series · 19 of 46 positions shown · IV contrast (omnipaque)
Comparison: None.

CLINICAL DATA: Chest pain, shortness of breath

EXAM:
CT ANGIOGRAPHY CHEST WITH CONTRAST
TECHNIQUE: Multidetector CT imaging of the chest was performed using the
standard protocol during bolus administration of intravenous
contrast. Multiplanar CT image reconstructions and MIPs were
obtained to evaluate the vascular anatomy.
CONTRAST:  100mL OMNIPAQUE IOHEXOL 350 MG/ML SOLN

[Series 4: axial arterial · axial · arterial · 0.69mm/px · z∈[-383,-92]mm · 16 of 109 slices shown]
[im 6/109  lung]
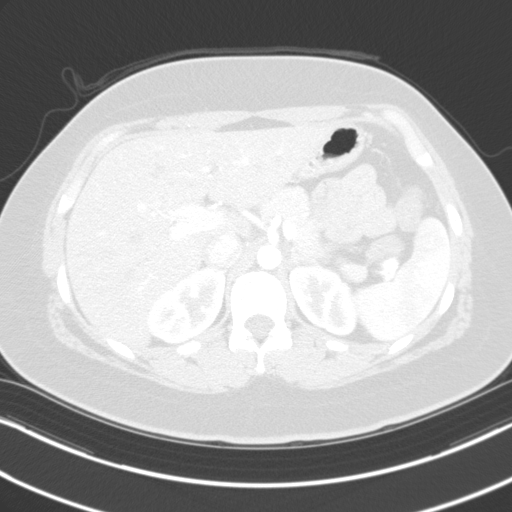
[im 12/109  soft-tissue]
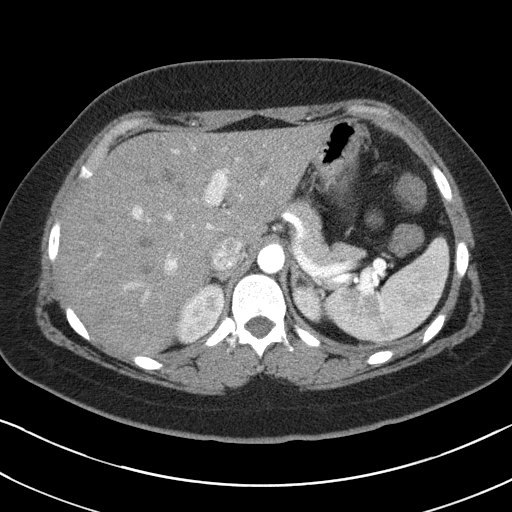
[im 18/109  lung]
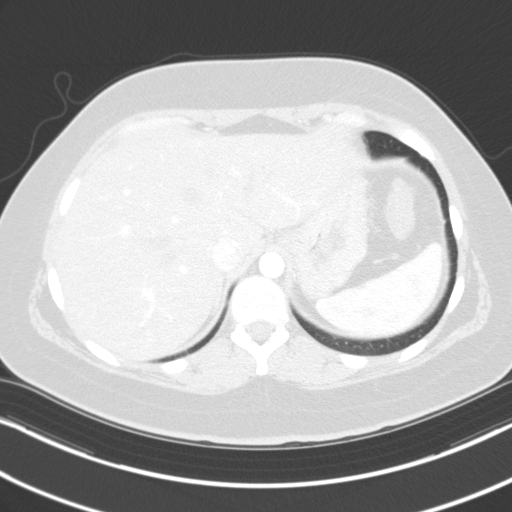
[im 23/109  soft-tissue]
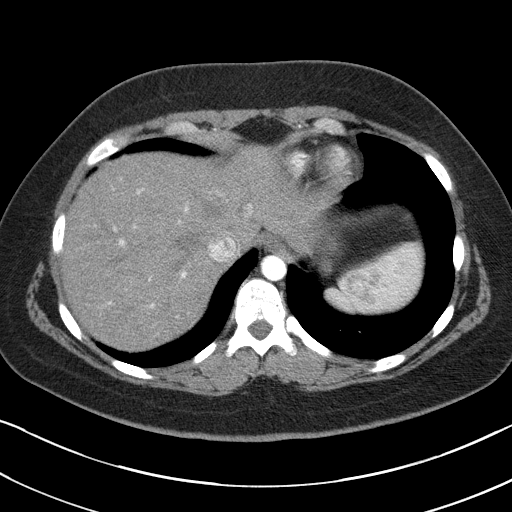
[im 35/109  lung]
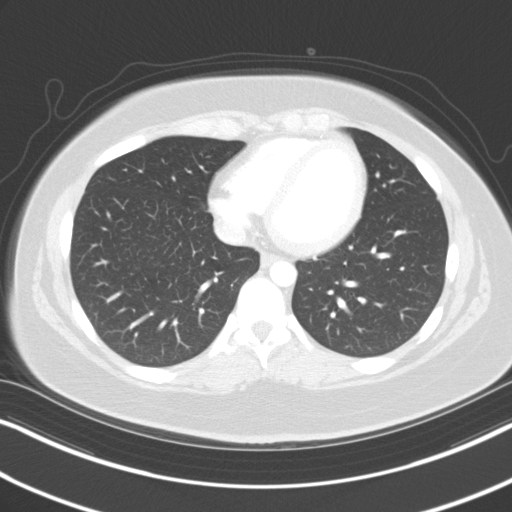
[im 40/109  soft-tissue]
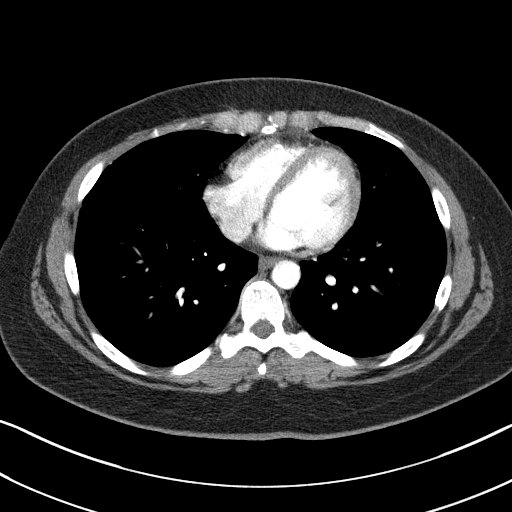
[im 46/109  lung]
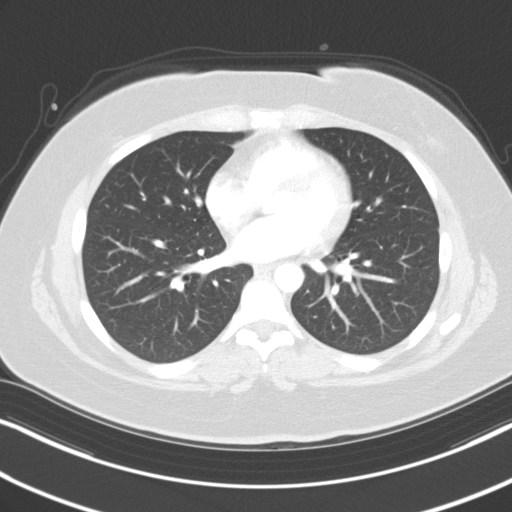
[im 52/109  soft-tissue]
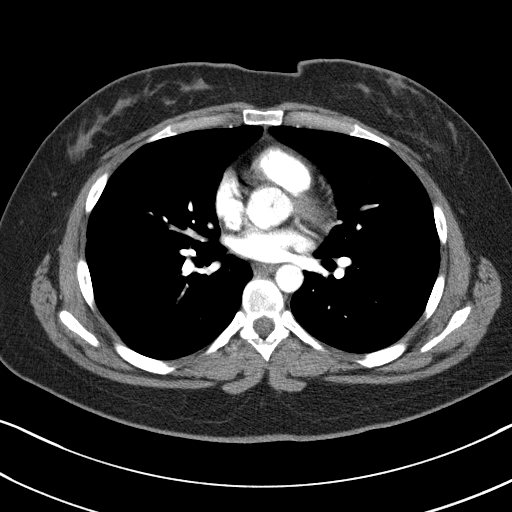
[im 57/109  lung]
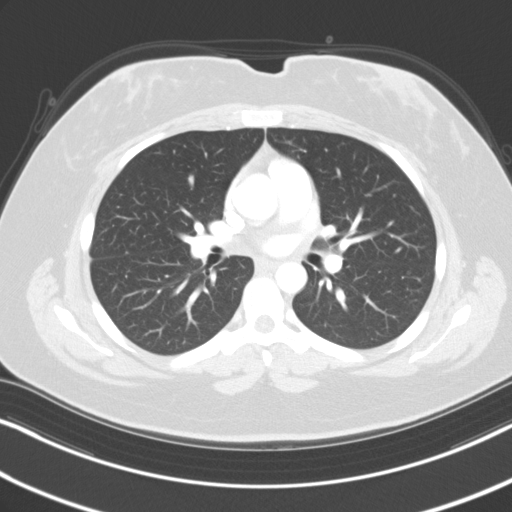
[im 63/109  soft-tissue]
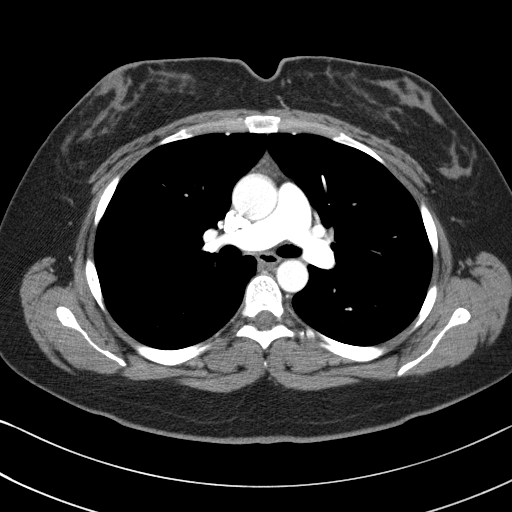
[im 69/109  lung]
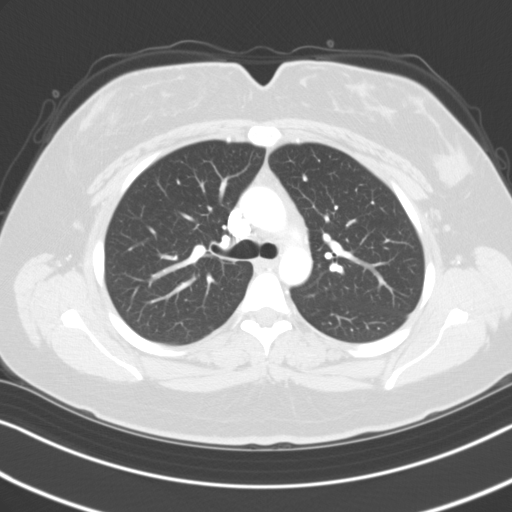
[im 74/109  soft-tissue]
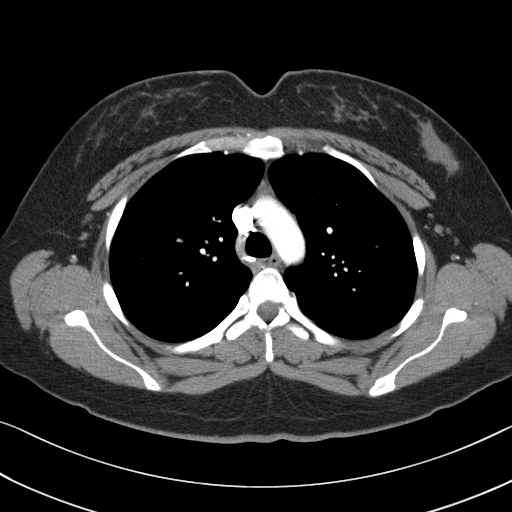
[im 86/109  lung]
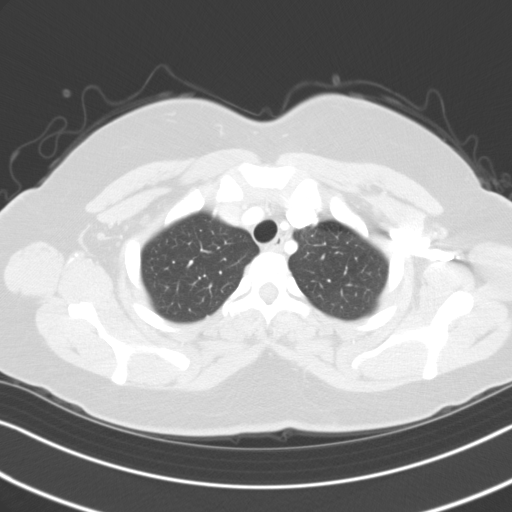
[im 91/109  soft-tissue]
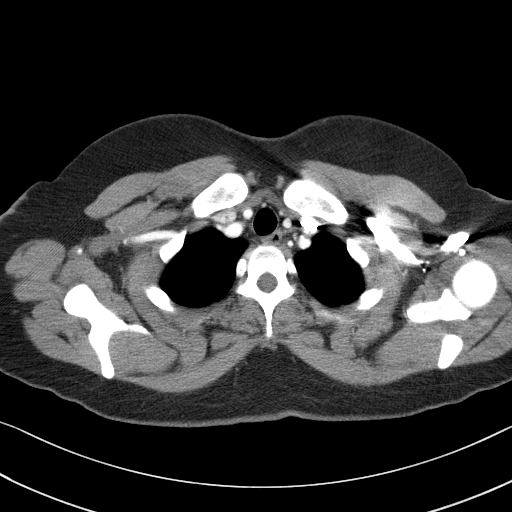
[im 97/109  lung]
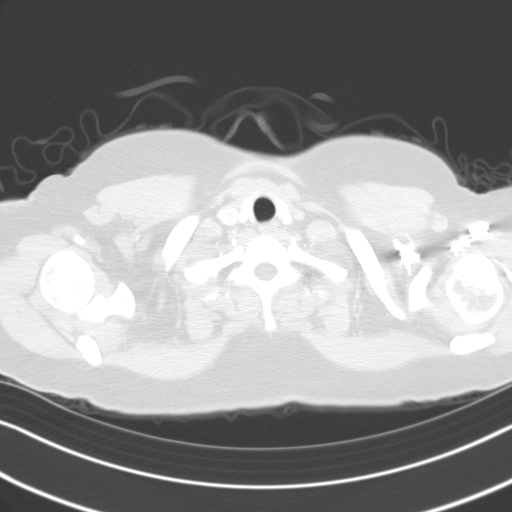
[im 103/109  soft-tissue]
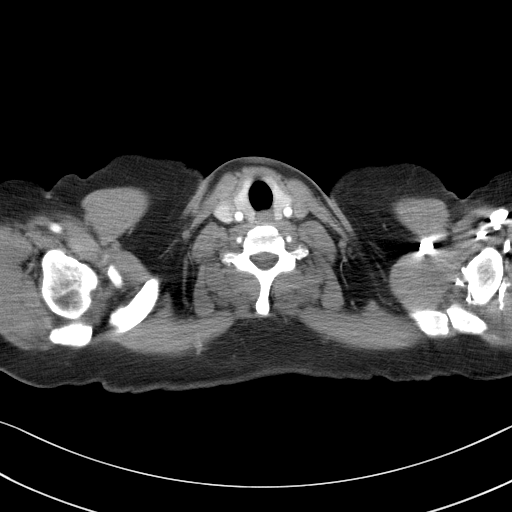

[Series 7: coronal · coronal · 0.65mm/px · 3 of 97 slices shown]
[im 25/97  soft-tissue]
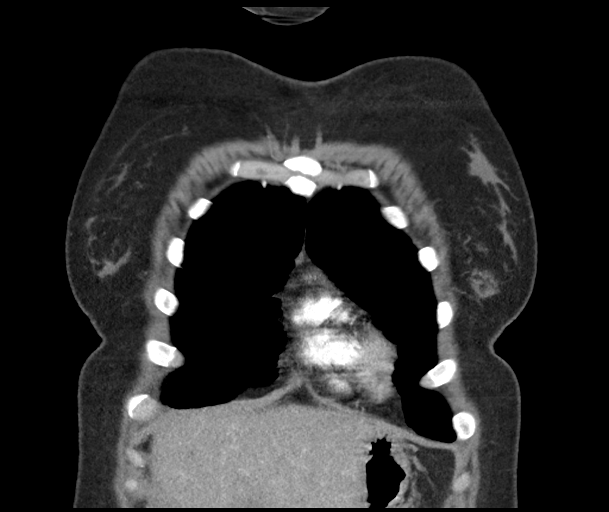
[im 49/97  soft-tissue]
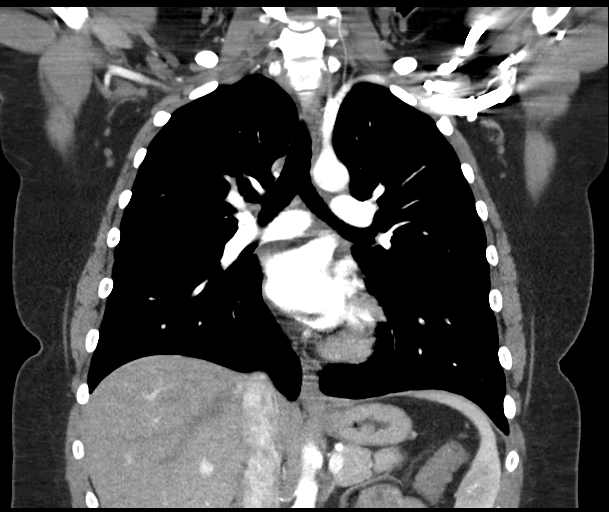
[im 73/97  soft-tissue]
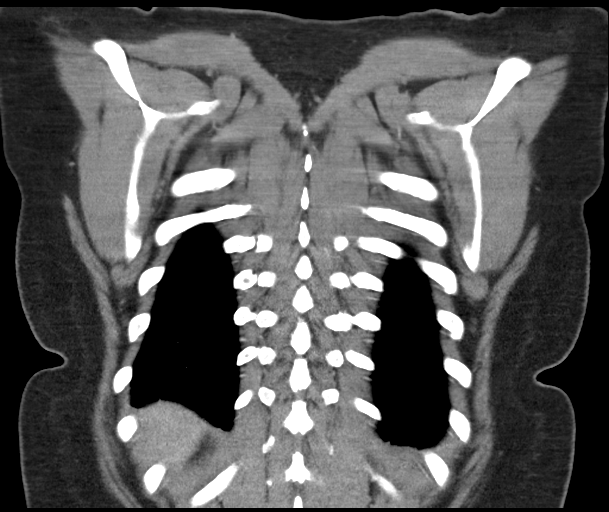

[19 of 46 positions shown; findings below may reference images not displayed]

FINDINGS: Cardiovascular: No evidence of pulmonary embolus. Heart is normal
size. Aorta is normal caliber.

Mediastinum/Nodes: No mediastinal, hilar, or axillary adenopathy.

Lungs/Pleura: No confluent opacities or effusions. 3 mm nodule in
the left lower lobe on image 47 of series 5. No effusions.

Upper Abdomen: Imaging into the upper abdomen demonstrates no acute
findings.

Musculoskeletal: Chest wall soft tissues are unremarkable. No acute
bony abnormality.

Review of the MIP images confirms the above findings.
IMPRESSION: 3 mm left lower lobe pulmonary nodule. No follow-up needed if
patient is low-risk. Non-contrast chest CT can be considered in 12
months if patient is high-risk. This recommendation follows the
consensus statement: Guidelines for Management of Incidental
Pulmonary Nodules Detected on CT Images: From the [HOSPITAL]

No evidence of pulmonary embolus.
